# Patient Record
Sex: Female | Born: 1995 | Race: Black or African American | Hispanic: No | Marital: Single | Smoking: Never smoker
Health system: Southern US, Community
[De-identification: ages and names within clinical notes are randomized; demographics above are authoritative.]

---

## 2016-06-30 ENCOUNTER — Emergency Department (HOSPITAL_COMMUNITY)
Admission: EM | Admit: 2016-06-30 | Discharge: 2016-06-30 | Disposition: A | Discharge status: Home / Self Care | Payer: Medicaid - Out of State | Attending: Emergency Medicine | Admitting: Emergency Medicine

## 2016-06-30 ENCOUNTER — Emergency Department (HOSPITAL_COMMUNITY): Payer: Medicaid - Out of State

## 2016-06-30 ENCOUNTER — Encounter (HOSPITAL_COMMUNITY): Payer: Self-pay

## 2016-06-30 DIAGNOSIS — W108XXA Fall (on) (from) other stairs and steps, initial encounter: Secondary | ICD-10-CM | POA: Insufficient documentation

## 2016-06-30 DIAGNOSIS — Y92009 Unspecified place in unspecified non-institutional (private) residence as the place of occurrence of the external cause: Secondary | ICD-10-CM | POA: Diagnosis not present

## 2016-06-30 DIAGNOSIS — S4992XA Unspecified injury of left shoulder and upper arm, initial encounter: Secondary | ICD-10-CM | POA: Diagnosis present

## 2016-06-30 DIAGNOSIS — Y939 Activity, unspecified: Secondary | ICD-10-CM | POA: Diagnosis not present

## 2016-06-30 DIAGNOSIS — Y999 Unspecified external cause status: Secondary | ICD-10-CM | POA: Insufficient documentation

## 2016-06-30 DIAGNOSIS — S43085A Other dislocation of left shoulder joint, initial encounter: Secondary | ICD-10-CM | POA: Insufficient documentation

## 2016-06-30 DIAGNOSIS — S43005A Unspecified dislocation of left shoulder joint, initial encounter: Secondary | ICD-10-CM

## 2016-06-30 MED ORDER — IBUPROFEN 600 MG PO TABS: 600.0000 mg | ORAL_TABLET | Freq: Four times a day (QID) | ORAL | 0 refills | Status: AC | PRN

## 2016-06-30 MED ORDER — HYDROCODONE-ACETAMINOPHEN 5-325 MG PO TABS: 1.0000 | ORAL_TABLET | ORAL | 0 refills | Status: AC | PRN

## 2016-06-30 MED ORDER — FENTANYL CITRATE (PF) 100 MCG/2ML IJ SOLN
25.0000 ug | Freq: Once | INTRAMUSCULAR | Status: AC
  Administered 2016-06-30: 25 ug via INTRAVENOUS
  Filled 2016-06-30: qty 2

## 2016-06-30 MED ORDER — ETOMIDATE 2 MG/ML IV SOLN
10.0000 mg | Freq: Once | INTRAVENOUS | Status: AC
  Administered 2016-06-30: 10 mg via INTRAVENOUS
  Filled 2016-06-30: qty 10

## 2016-06-30 NOTE — ED Notes (Signed)
Pt. Crying stating she needs additional pain medication. Pt. Informed that we cannot administer additional medication at this time. Pt. Understanding, given warm blanket and assisted with repositioning. Pt. Transported to xray.

## 2016-06-30 NOTE — Discharge Instructions (Signed)
F/U with your orthopedist when you get home.

## 2016-06-30 NOTE — ED Provider Notes (Signed)
[facility]-EMERGENCY DEPT Provider Note   CSN: [id number] Arrival date & time: 06/30/16  0849     History   Chief Complaint Chief Complaint  Patient presents with  . Shoulder Injury    HPI Angelica Wheeler is a 19 y.o. female.  Pt fell down about 6 steps today at her grandparent's house.  The pt hurt her left shoulder.  She had no other injuries.  She had no loc.  She was ambulatory after the fall.  EMS was called and put pt's shoulder in a splint.  She was also given 150 mcg of fentanyl en route and is somnolent now.  She is unable to answer any questions.  Grandparents give the hx.  Pt has a hx of left shoulder dislocations, but she is usually able to get it back in place.  This felt "too tight" when she tried to reduce it at home.      History reviewed. No pertinent past medical history.  There are no active problems to display for this patient.   History reviewed. No pertinent surgical history.  OB History    No data available       Home Medications    Prior to Admission medications   Medication Sig Start Date End Date Taking? Authorizing Provider  HYDROcodone-acetaminophen (NORCO/VICODIN) 5-325 MG tablet Take 1 tablet by mouth every 4 (four) hours as needed. 06/30/16   Jacalyn Lefevre, MD  ibuprofen (ADVIL,MOTRIN) 600 MG tablet Take 1 tablet (600 mg total) by mouth every 6 (six) hours as needed. 06/30/16   Jacalyn Lefevre, MD    Family History History reviewed. No pertinent family history.  Social History Social History  Substance Use Topics  . Smoking status: Never Smoker  . Smokeless tobacco: Never Used  . Alcohol use No     Allergies   Review of patient's allergies indicates no known allergies.   Review of Systems Review of Systems  Musculoskeletal:       Left shoulder pain  All other systems reviewed and are negative.    Physical Exam Updated Vital Signs BP 119/92   Pulse 70   Temp 97.8 F (36.6 C) (Oral)   Resp 18   Ht 5\' 8"  (1.727 m)   Wt  163 lb (73.9 kg)   LMP 06/30/2016 Comment: currently spotting  SpO2 99%   BMI 24.78 kg/m   Physical Exam  Constitutional: She appears well-developed and well-nourished.  HENT:  Head: Normocephalic and atraumatic.  Right Ear: External ear normal.  Left Ear: External ear normal.  Nose: Nose normal.  Mouth/Throat: Oropharynx is clear and moist.  Eyes: Conjunctivae and EOM are normal. Pupils are equal, round, and reactive to light.  Neck: Normal range of motion. Neck supple.  Cardiovascular: Normal rate, regular rhythm, normal heart sounds and intact distal pulses.   Pulmonary/Chest: Effort normal and breath sounds normal.  Abdominal: Soft. Bowel sounds are normal.  Musculoskeletal:       Left shoulder: She exhibits tenderness and swelling.  Neurological:  Somnolent after fentanyl  Skin: Skin is warm.  Psychiatric: She has a normal mood and affect. Her behavior is normal. Judgment and thought content normal.  Nursing note and vitals reviewed.    ED Treatments / Results  Labs (all labs ordered are listed, but only abnormal results are displayed) Labs Reviewed - No data to display  EKG  EKG Interpretation None       Radiology Dg Shoulder Left  Result Date: 06/30/2016 CLINICAL DATA:  20 year old female with  history of fall down the stairs this morning complaining of left-sided shoulder pain. EXAM: LEFT SHOULDER - 2+ VIEW COMPARISON:  No priors. FINDINGS: Two views of the left shoulder demonstrate an anterior subcoracoid shoulder dislocation. No definite acute displaced fracture identified. IMPRESSION: 1. Anterior subcoracoid left shoulder dislocation. Electronically Signed   By: Trudie Reed M.D.   On: 06/30/2016 09:59   Dg Shoulder Left Portable  Result Date: 06/30/2016 CLINICAL DATA:  Left shoulder dislocation, postreduction EXAM: LEFT SHOULDER - 1 VIEW COMPARISON:  06/30/2016 FINDINGS: Improved alignment following reduction. Left glenohumeral joint appears aligned  without acute osseous finding or definite fracture. No significant joint abnormality. AC joint unremarkable. IMPRESSION: Improved left shoulder alignment following reduction Electronically Signed   By: Judie Petit.  Shick M.D.   On: 06/30/2016 10:59    Procedures .Sedation Date/Time: 06/30/2016 11:04 AM Performed by: Jacalyn Lefevre Authorized by: Jacalyn Lefevre   Consent:    Consent obtained:  Written   Consent given by:  Patient   Risks discussed:  Allergic reaction, dysrhythmia, inadequate sedation, nausea, prolonged hypoxia resulting in organ damage, prolonged sedation necessitating reversal, respiratory compromise necessitating ventilatory assistance and intubation and vomiting   Alternatives discussed:  Analgesia without sedation Indications:    Procedure performed:  Dislocation reduction   Procedure necessitating sedation performed by:  Physician performing sedation   Intended level of sedation:  Moderate (conscious sedation) Pre-sedation assessment:    NPO status caution: urgency dictates proceeding with non-ideal NPO status     Neck mobility: normal     Mouth opening:  3 or more finger widths   Pre-sedation assessments completed and reviewed: airway patency, cardiovascular function, hydration status, mental status, nausea/vomiting, pain level, respiratory function and temperature     History of difficult intubation: no   Immediate pre-procedure details:    Reassessment: Patient reassessed immediately prior to procedure     Reviewed: vital signs and relevant labs/tests     Verified: bag valve mask available, emergency equipment available, intubation equipment available, IV patency confirmed and oxygen available   Procedure details (see MAR for exact dosages):    Preoxygenation:  Room air   Sedation:  Etomidate   Analgesia:  Fentanyl   Intra-procedure monitoring:  Blood pressure monitoring, cardiac monitor, continuous capnometry, continuous pulse oximetry, frequent LOC assessments and  frequent vital sign checks   Intra-procedure events: none   Post-procedure details:    Attendance: Constant attendance by certified staff until patient recovered     Recovery: Patient returned to pre-procedure baseline     Estimated blood loss (see I/O flowsheets): no     Complications:  None   Post-sedation assessments completed and reviewed: airway patency, cardiovascular function, hydration status, mental status, nausea/vomiting, pain level, respiratory function and temperature     Specimens recovered:  None   Patient is stable for discharge or admission: yes     Patient tolerance:  Tolerated well, no immediate complications Comments:     Please see nurse's notes for times. Reduction of dislocation Date/Time: 06/30/2016 11:05 AM Performed by: Jacalyn Lefevre Authorized by: Jacalyn Lefevre  Consent: Written consent obtained. Risks and benefits: risks, benefits and alternatives were discussed Consent given by: patient Patient understanding: patient states understanding of the procedure being performed Patient consent: the patient's understanding of the procedure matches consent given Procedure consent: procedure consent matches procedure scheduled Relevant documents: relevant documents present and verified Test results: test results available and properly labeled Site marked: the operative site was marked Imaging studies: imaging studies available Patient identity  confirmed: verbally with patient Time out: Immediately prior to procedure a "time out" was called to verify the correct patient, procedure, equipment, support staff and site/side marked as required. Preparation: Patient was prepped and draped in the usual sterile fashion. Local anesthesia used: no  Anesthesia: Local anesthesia used: no  Sedation: Patient sedated: yes Sedatives: etomidate Analgesia: fentanyl Vitals: Vital signs were monitored during sedation. Patient tolerance: Patient tolerated the procedure well  with no immediate complications Comments: See nurse's note for times.    (including critical care time)  Medications Ordered in ED Medications  fentaNYL (SUBLIMAZE) injection 25 mcg (25 mcg Intravenous Given 06/30/16 0922)  etomidate (AMIDATE) injection 10 mg (10 mg Intravenous Given 06/30/16 1019)     Initial Impression / Assessment and Plan / ED Course  I have reviewed the triage vital signs and the nursing notes.  Pertinent labs & imaging results that were available during my care of the patient were reviewed by me and considered in my medical decision making (see chart for details).  Clinical Course    Pt is feeling much better.  She is awake and alert.  She is ok for d/c.  Final Clinical Impressions(s) / ED Diagnoses   Final diagnoses:  Dislocation of left shoulder joint, initial encounter    New Prescriptions New Prescriptions   HYDROCODONE-ACETAMINOPHEN (NORCO/VICODIN) 5-325 MG TABLET    Take 1 tablet by mouth every 4 (four) hours as needed.   IBUPROFEN (ADVIL,MOTRIN) 600 MG TABLET    Take 1 tablet (600 mg total) by mouth every 6 (six) hours as needed.     Jacalyn LefevreJulie Imya Mance, MD 06/30/16 [phone]

## 2016-06-30 NOTE — ED Triage Notes (Signed)
[facility]. [facility]. Pt. Getting ready for a funeral and had a mechanical fall down the stairs. Pt. Is from out of town. Pt. Denies LOC. Left shoulder splinted on arrival. Possible left shoulder dislocation. Pt. Given 150mcg fentanyl en route. Pt. sts pain 9/10. Pt. Denies any allergies to medications or any medical history.

## 2018-06-17 IMAGING — DX DG SHOULDER 2+V*L*
2 series · 2 of 2 positions shown · non-contrast
Comparison: No priors.

CLINICAL DATA: 20-year-old female with history of fall down the
stairs this morning complaining of left-sided shoulder pain.

EXAM:
LEFT SHOULDER - 2+ VIEW

[x shoulder ap left]
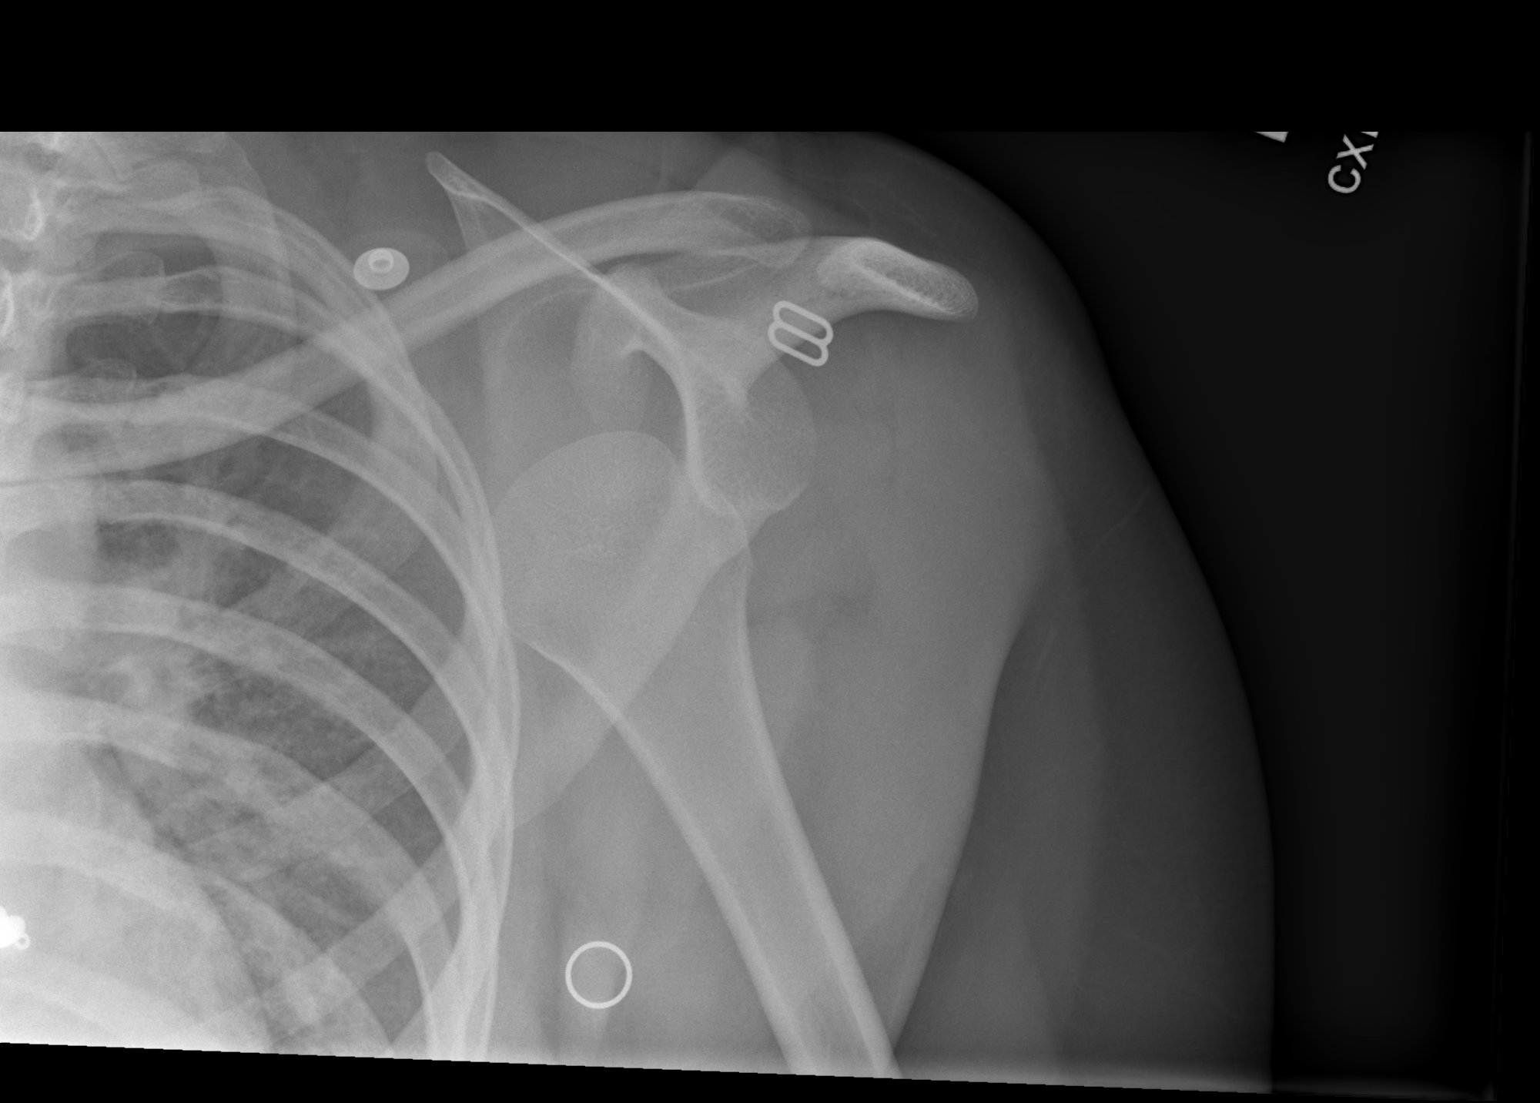

[x scapula y-view left]
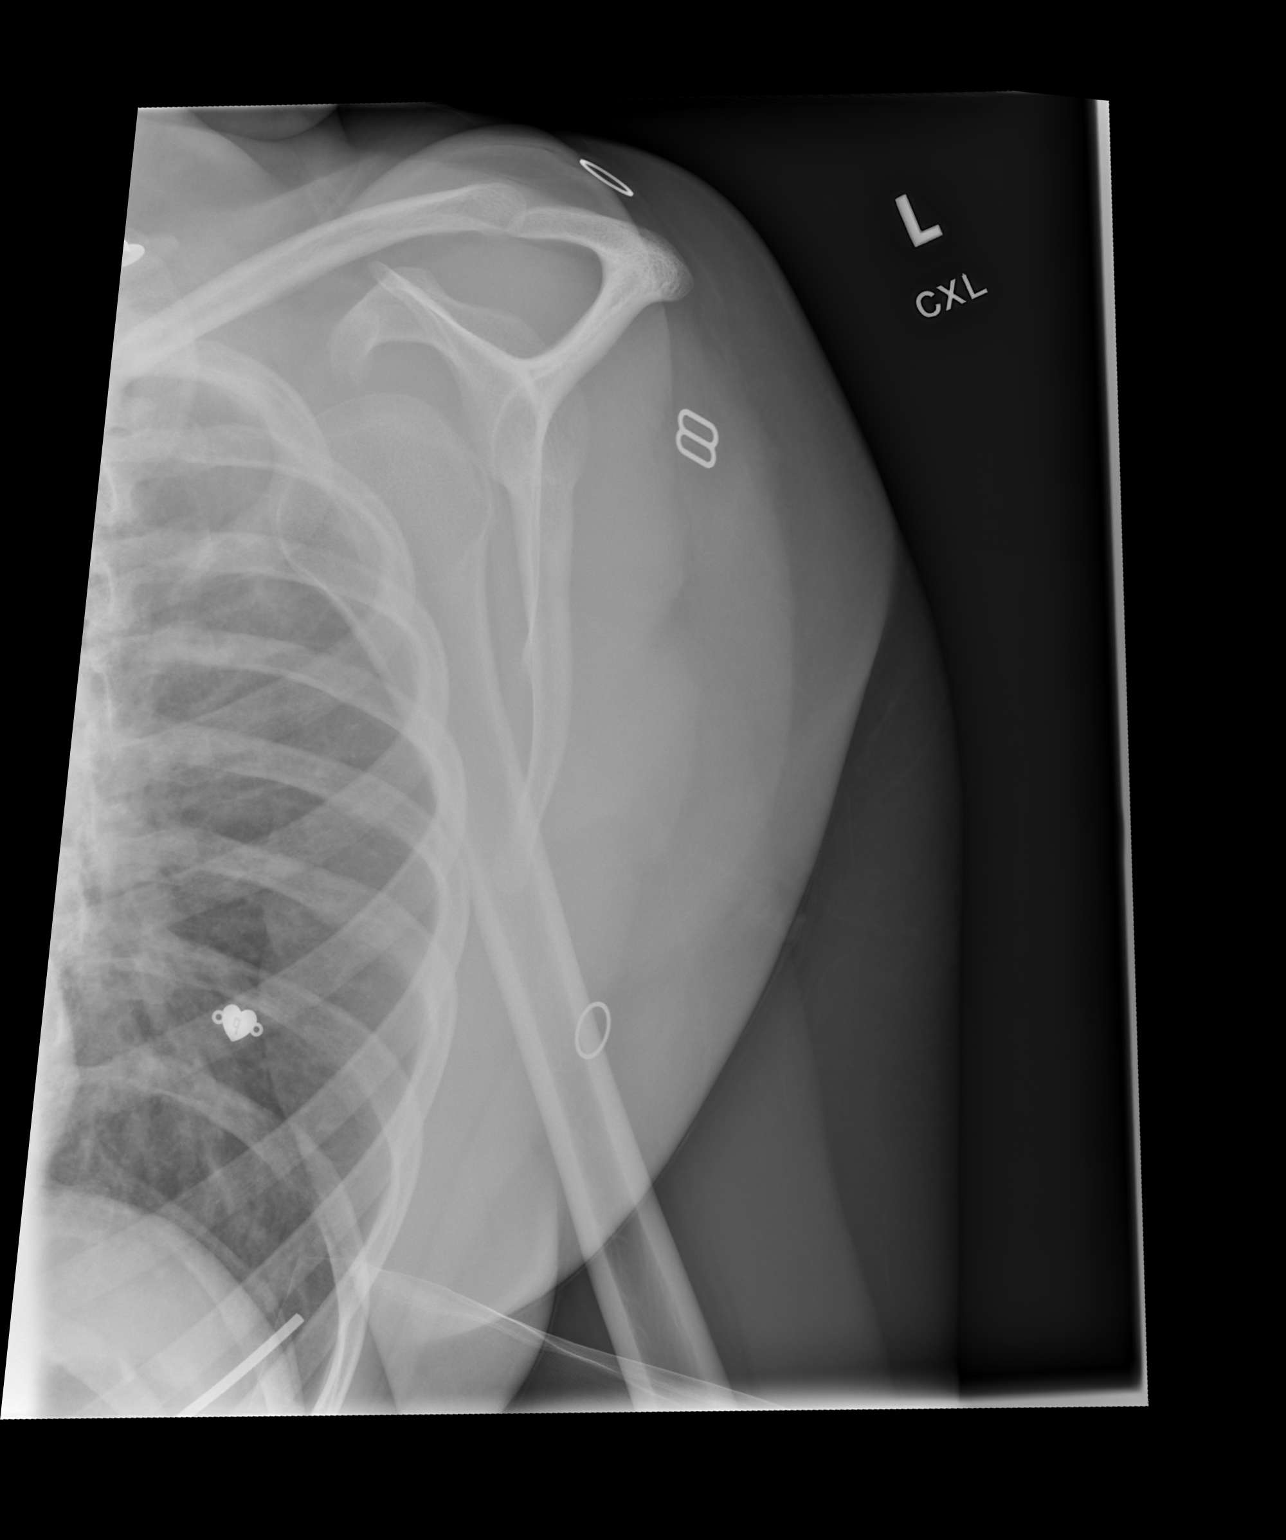

[2 of 2 positions shown; findings below may reference images not displayed]

FINDINGS: Two views of the left shoulder demonstrate an anterior subcoracoid
shoulder dislocation. No definite acute displaced fracture
identified.
IMPRESSION: 1. Anterior subcoracoid left shoulder dislocation.

## 2018-06-17 IMAGING — CR DG SHOULDER 1V*L*
2 series · 2 of 2 positions shown · non-contrast
Comparison: 06/30/2016

CLINICAL DATA: Left shoulder dislocation, postreduction

EXAM:
LEFT SHOULDER - 1 VIEW

[AP (1 of 2)]
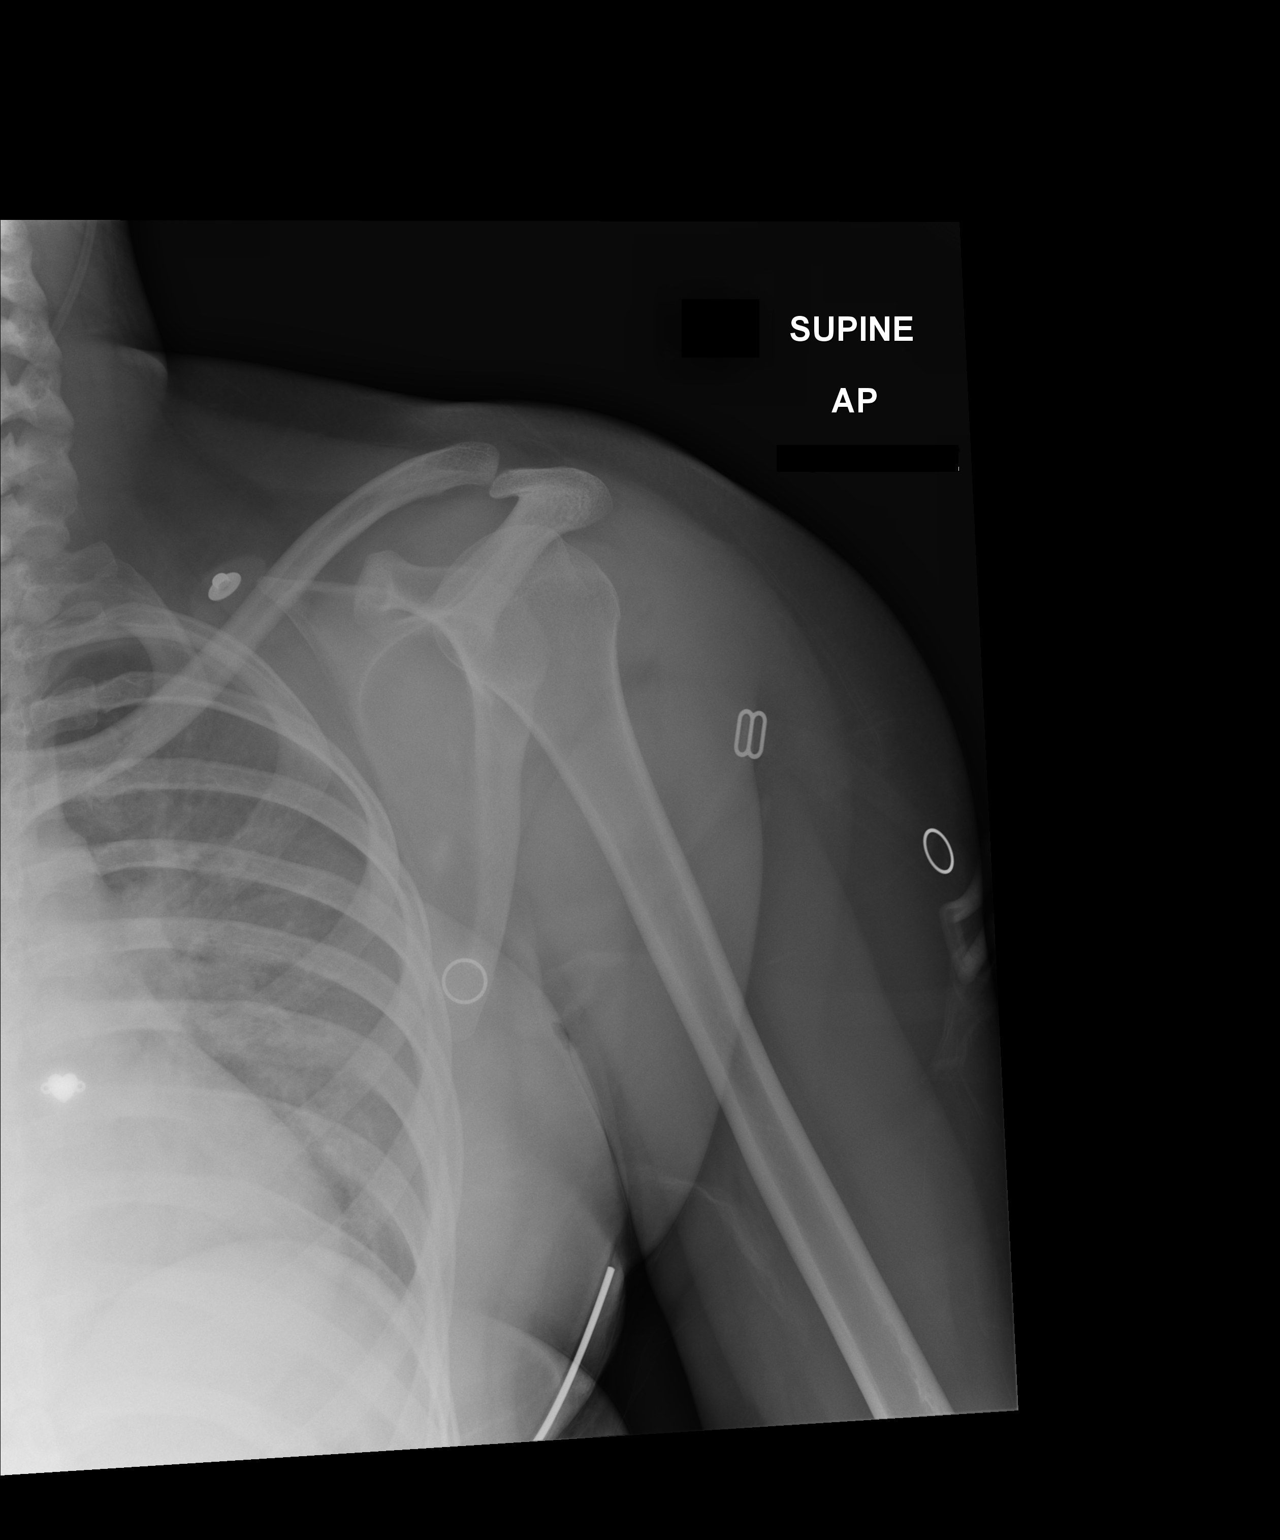

[AP (2 of 2)]
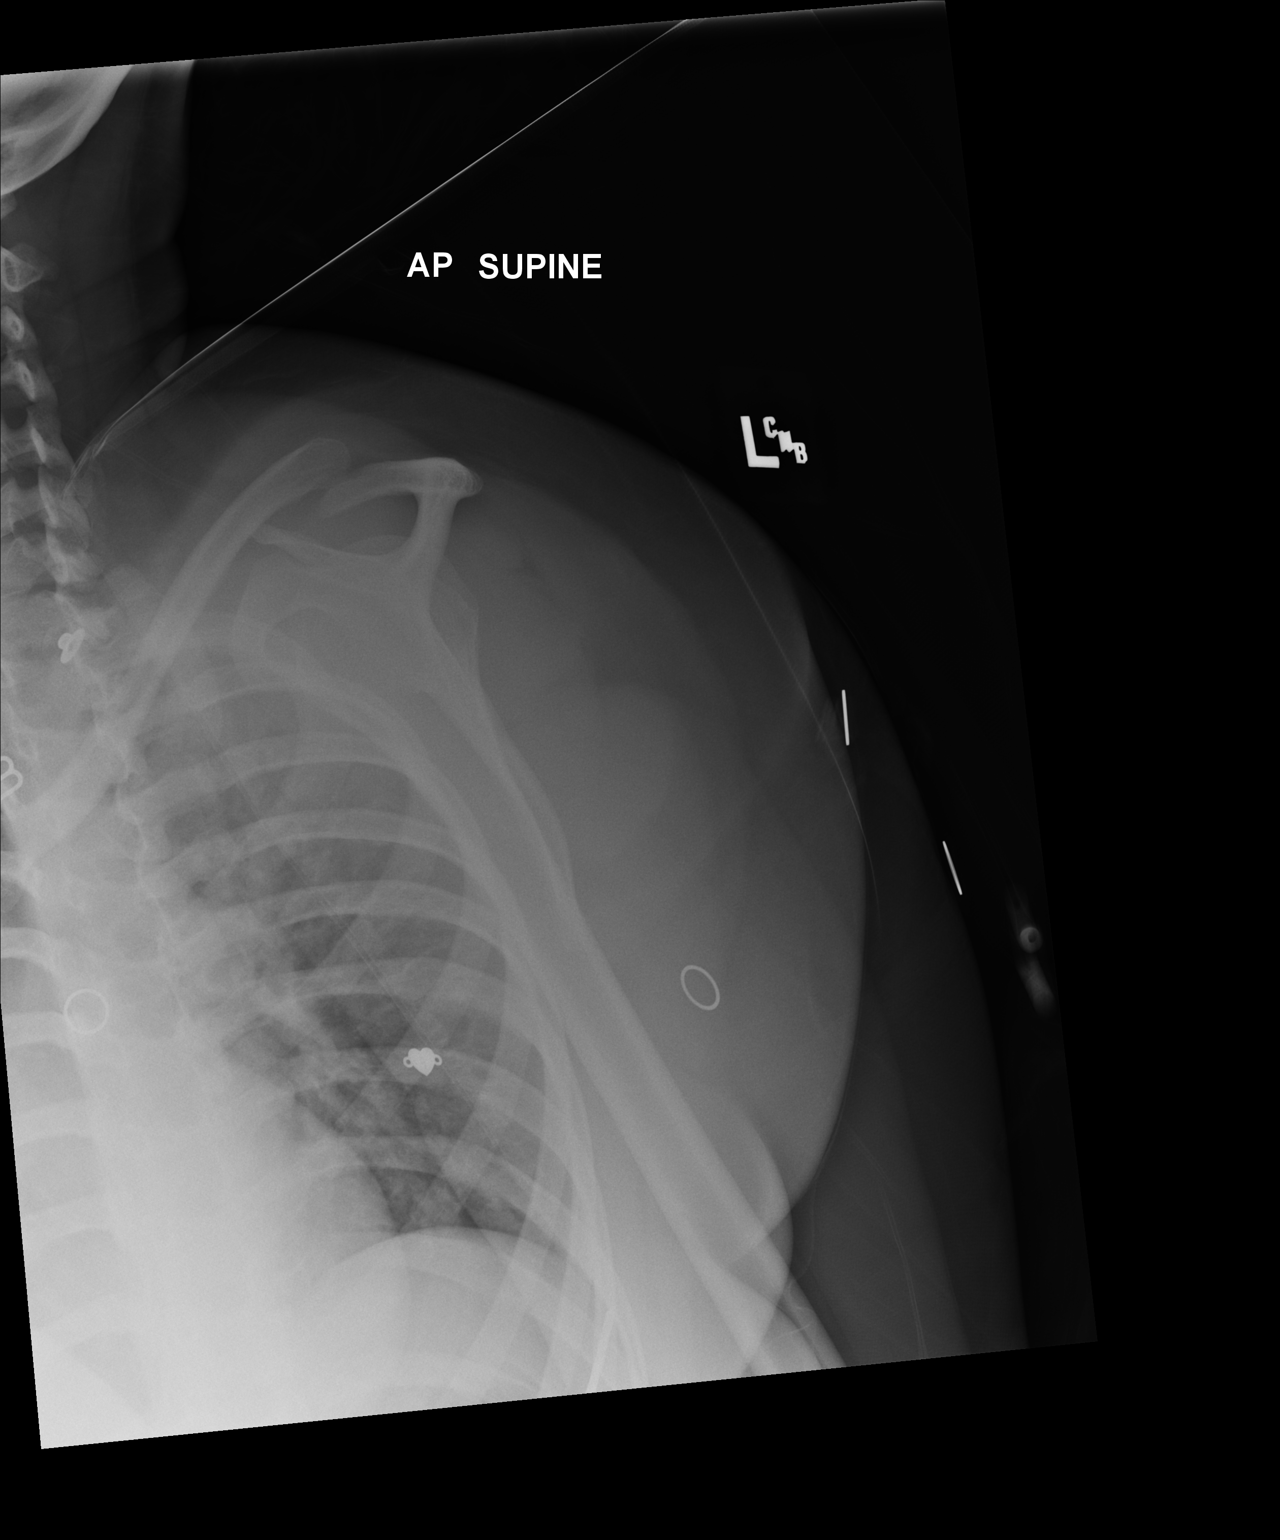

[2 of 2 positions shown; findings below may reference images not displayed]

FINDINGS: Improved alignment following reduction. Left glenohumeral joint
appears aligned without acute osseous finding or definite fracture.
No significant joint abnormality. AC joint unremarkable.
IMPRESSION: Improved left shoulder alignment following reduction
